# Patient Record
Sex: Male | Born: 1960 | Race: Black or African American | Hispanic: No | Marital: Single
Health system: Southern US, Community
[De-identification: ages and names within clinical notes are randomized; demographics above are authoritative.]

## PROBLEM LIST (undated history)

## (undated) DIAGNOSIS — F10231 Alcohol dependence with withdrawal delirium: Secondary | ICD-10-CM

## (undated) DIAGNOSIS — G40909 Epilepsy, unspecified, not intractable, without status epilepticus: Secondary | ICD-10-CM

## (undated) DIAGNOSIS — J9621 Acute and chronic respiratory failure with hypoxia: Secondary | ICD-10-CM

## (undated) DIAGNOSIS — F201 Disorganized schizophrenia: Secondary | ICD-10-CM

## (undated) DIAGNOSIS — G9341 Metabolic encephalopathy: Secondary | ICD-10-CM

## (undated) DIAGNOSIS — F10931 Alcohol use, unspecified with withdrawal delirium: Secondary | ICD-10-CM

---

## 2019-12-14 ENCOUNTER — Other Ambulatory Visit (HOSPITAL_COMMUNITY): Payer: Medicare Other

## 2019-12-14 ENCOUNTER — Inpatient Hospital Stay
Admit: 2019-12-14 | Discharge: 2020-01-03 | Disposition: A | Discharge status: Skilled Nursing Facility | Payer: Medicare Other | Source: Other Acute Inpatient Hospital | Attending: Internal Medicine | Admitting: Internal Medicine

## 2019-12-14 DIAGNOSIS — G40909 Epilepsy, unspecified, not intractable, without status epilepticus: Secondary | ICD-10-CM

## 2019-12-14 DIAGNOSIS — J9690 Respiratory failure, unspecified, unspecified whether with hypoxia or hypercapnia: Secondary | ICD-10-CM

## 2019-12-14 DIAGNOSIS — J9621 Acute and chronic respiratory failure with hypoxia: Secondary | ICD-10-CM | POA: Diagnosis present

## 2019-12-14 DIAGNOSIS — F10231 Alcohol dependence with withdrawal delirium: Secondary | ICD-10-CM | POA: Diagnosis present

## 2019-12-14 DIAGNOSIS — Z4659 Encounter for fitting and adjustment of other gastrointestinal appliance and device: Secondary | ICD-10-CM

## 2019-12-14 DIAGNOSIS — F201 Disorganized schizophrenia: Secondary | ICD-10-CM | POA: Diagnosis present

## 2019-12-14 DIAGNOSIS — L539 Erythematous condition, unspecified: Secondary | ICD-10-CM

## 2019-12-14 DIAGNOSIS — J189 Pneumonia, unspecified organism: Secondary | ICD-10-CM

## 2019-12-14 DIAGNOSIS — G9341 Metabolic encephalopathy: Secondary | ICD-10-CM | POA: Diagnosis present

## 2019-12-14 DIAGNOSIS — F10931 Alcohol use, unspecified with withdrawal delirium: Secondary | ICD-10-CM | POA: Diagnosis present

## 2019-12-14 HISTORY — DX: Alcohol dependence with withdrawal delirium: F10.231

## 2019-12-14 HISTORY — DX: Disorganized schizophrenia: F20.1

## 2019-12-14 HISTORY — DX: Metabolic encephalopathy: G93.41

## 2019-12-14 HISTORY — DX: Alcohol use, unspecified with withdrawal delirium: F10.931

## 2019-12-14 HISTORY — DX: Acute and chronic respiratory failure with hypoxia: J96.21

## 2019-12-14 HISTORY — DX: Epilepsy, unspecified, not intractable, without status epilepticus: G40.909

## 2019-12-15 ENCOUNTER — Other Ambulatory Visit (HOSPITAL_COMMUNITY): Payer: Medicare Other

## 2019-12-15 LAB — CBC
HCT: 33.4 % — ABNORMAL LOW (ref 39.0–52.0)
Hemoglobin: 11 g/dL — ABNORMAL LOW (ref 13.0–17.0)
MCH: 30.6 pg (ref 26.0–34.0)
MCHC: 32.9 g/dL (ref 30.0–36.0)
MCV: 93 fL (ref 80.0–100.0)
Platelets: 206 10*3/uL (ref 150–400)
RBC: 3.59 MIL/uL — ABNORMAL LOW (ref 4.22–5.81)
RDW: 17.2 % — ABNORMAL HIGH (ref 11.5–15.5)
WBC: 12.4 10*3/uL — ABNORMAL HIGH (ref 4.0–10.5)
nRBC: 0 % (ref 0.0–0.2)

## 2019-12-15 LAB — COMPREHENSIVE METABOLIC PANEL
ALT: 38 U/L (ref 0–44)
AST: 71 U/L — ABNORMAL HIGH (ref 15–41)
Albumin: 2.3 g/dL — ABNORMAL LOW (ref 3.5–5.0)
Alkaline Phosphatase: 111 U/L (ref 38–126)
Anion gap: 10 (ref 5–15)
BUN: 8 mg/dL (ref 6–20)
CO2: 25 mmol/L (ref 22–32)
Calcium: 8.5 mg/dL — ABNORMAL LOW (ref 8.9–10.3)
Chloride: 102 mmol/L (ref 98–111)
Creatinine, Ser: 1.21 mg/dL (ref 0.61–1.24)
GFR calc Af Amer: >60 mL/min (ref >60)
GFR calc non Af Amer: >60 mL/min (ref >60)
Glucose, Bld: 105 mg/dL — ABNORMAL HIGH (ref 70–99)
Potassium: 4.2 mmol/L (ref 3.5–5.1)
Sodium: 137 mmol/L (ref 135–145)
Total Bilirubin: 1 mg/dL (ref 0.3–1.2)
Total Protein: 7 g/dL (ref 6.5–8.1)

## 2019-12-15 LAB — PROTIME-INR
INR: 1.2 (ref 0.8–1.2)
Prothrombin Time: 14.2 seconds (ref 11.4–15.2)

## 2019-12-16 ENCOUNTER — Other Ambulatory Visit (HOSPITAL_COMMUNITY): Payer: Medicare Other

## 2019-12-17 ENCOUNTER — Other Ambulatory Visit (HOSPITAL_COMMUNITY): Payer: Medicare Other

## 2019-12-17 DIAGNOSIS — G9341 Metabolic encephalopathy: Secondary | ICD-10-CM | POA: Diagnosis not present

## 2019-12-17 DIAGNOSIS — F201 Disorganized schizophrenia: Secondary | ICD-10-CM | POA: Diagnosis not present

## 2019-12-17 DIAGNOSIS — J9621 Acute and chronic respiratory failure with hypoxia: Secondary | ICD-10-CM | POA: Diagnosis not present

## 2019-12-17 DIAGNOSIS — G40909 Epilepsy, unspecified, not intractable, without status epilepticus: Secondary | ICD-10-CM

## 2019-12-17 DIAGNOSIS — F10231 Alcohol dependence with withdrawal delirium: Secondary | ICD-10-CM

## 2019-12-17 LAB — BLOOD GAS, ARTERIAL
Acid-Base Excess: 4 mmol/L — ABNORMAL HIGH (ref 0.0–2.0)
Bicarbonate: 27.1 mmol/L (ref 20.0–28.0)
FIO2: 44
O2 Saturation: 92.4 %
Patient temperature: 37.6
pCO2 arterial: 36 mmHg (ref 32.0–48.0)
pH, Arterial: 7.493 — ABNORMAL HIGH (ref 7.350–7.450)
pO2, Arterial: 63.8 mmHg — ABNORMAL LOW (ref 83.0–108.0)

## 2019-12-17 NOTE — Consult Note (Signed)
Pulmonary Critical Care Medicine [facility] [place]  PULMONARY SERVICE  Date of Service: 12/17/2019  PULMONARY CRITICAL CARE Glenn Chapman  SNK:[id number]  DOB: 1960/08/19   DOA: 12/14/2019  Referring Physician: Carron Curie, MD  HPI: Glenn Chapman is a 59 y.o. male seen for follow up of Acute on Chronic Respiratory Failure.  Patient has multiple medical problems brought into the hospital because of having been found unresponsive by the family.  Patient apparently may have had a seizure though it was not witnessed.  When he was in the hospital became more agitated and essentially went into delirium tremens.  Was treated with Precedex and also required Ativan.  The patient now is transferred to [place] for further management he continues to have copious amounts of secretions.  There is concern for loss of airway protection.  Patient right now has a nasal trumpet and is on oxygen therapy.  Review of Systems:  ROS performed and is unremarkable other than noted above.  Past medical history: Schizophrenia Alcohol abuse Alcoholic fatty liver Alcoholic hepatitis Seizure disorder Thrombocytopenia Traumatic brain injury Hematemesis Chronic headaches Cognitive deficits  Past Surgical History:  Procedure Laterality Date  . DEBRIDE ASSOC FX/DISLO SKIN/MUS/BONE Left 11/18/2015  Procedure: LEG LOWER DEBRIDEMENT SKIN, MUSCLE AND BONE; Surgeon: Georgetta Haber, MD; Location: [facility] MAIN OR  . DECOMPRESS ANT/LAT LEG CMPART Left 11/18/2015  Procedure: LEG, LOWER, FASCIOTOMY; Surgeon: Georgetta Haber, MD; Location: [facility] MAIN OR  . TREAT TIBIAL SHAFT FX, INTRAMED IMPLANT Left 11/18/2015  Procedure: TIBIAL SHAFT FRACTURE, BY INTRMEDULLARY IMPLANTW/ OR W/OUT [place]; Surgeon: Georgetta Haber, MD; Location: [facility] MAIN OR   Family History  Problem Relation Name Age of Onset  . Heart Attack Father  . Hypertension Sister  . Diabetes Sister  . Seizures Sister  . Diabetes Maternal  Grandmother    No Known Allergies Social History   Socioeconomic History  . Marital status: Single  Spouse name: Not on file  . Number of children: Not on file  . Years of education: Not on file  . Highest education level: Not on file  Occupational History  . Not on file  Social Needs  . Financial resource strain: Not on file  . Food insecurity  Worry: Not on file  Inability: Not on file  . Transportation needs  Medical: Not on file  Non-medical: Not on file  Tobacco Use  . Smoking status: Never Smoker  . Smokeless tobacco: Never Used  Substance and Sexual Activity  . Alcohol use: Yes  Alcohol/week: 4.0 - 5.0 standard drinks  Types: 4 - 5 Cans of beer per week    Family History: Non-Contributory to the present illness    Medications: Reviewed on Rounds  Physical Exam:  Vitals: Temperature 101.2 pulse 110 respiratory 33 blood pressure is 142/82 saturations 97%  Ventilator Settings right now is on 6 L of oxygen with nasal trumpet  . General: Comfortable at this time . Eyes: Grossly normal lids, irises & conjunctiva . ENT: grossly tongue is normal . Neck: no obvious mass . Cardiovascular: S1-S2 normal no gallop or rub . Respiratory: Scattered very coarse breath sounds with increased upper airway secretions . Abdomen: Soft nontender . Skin: no rash seen on limited exam . Musculoskeletal: not rigid . Psychiatric:unable to assess . Neurologic: no seizure no involuntary movements         Labs on Admission:  Basic Metabolic Panel: Recent Labs  Lab 12/15/19 0517  NA 137  K 4.2  CL 102  CO2 25  GLUCOSE 105*  BUN 8  CREATININE 1.21  CALCIUM 8.5*    Recent Labs  Lab 12/17/19 1050  PHART 7.493*  PCO2ART 36.0  PO2ART 63.8*  HCO3 27.1  O2SAT 92.4    Liver Function Tests: Recent Labs  Lab 12/15/19 0517  AST 71*  ALT 38  ALKPHOS 111  BILITOT 1.0  PROT 7.0  ALBUMIN 2.3*   No results for input(s): LIPASE, AMYLASE in the last 168 hours. No  results for input(s): AMMONIA in the last 168 hours.  CBC: Recent Labs  Lab 12/15/19 0517  WBC 12.4*  HGB 11.0*  HCT 33.4*  MCV 93.0  PLT 206    Cardiac Enzymes: No results for input(s): CKTOTAL, CKMB, CKMBINDEX, TROPONINI in the last 168 hours.  BNP (last 3 results) No results for input(s): BNP in the last 8760 hours.  ProBNP (last 3 results) No results for input(s): PROBNP in the last 8760 hours.   Radiological Exams on Admission: DG Chest Port 1 View  Result Date: 12/15/2019 CLINICAL DATA:  Pneumonia EXAM: PORTABLE CHEST 1 VIEW COMPARISON:  None. FINDINGS: Nasogastric tube tip and side port are in the stomach. There is atelectatic change in the lung bases. Lungs elsewhere clear. Heart is upper normal in size with pulmonary vascularity normal. No adenopathy. No bone lesions. IMPRESSION: Bibasilar atelectasis. Early pneumonia in the bases, particular on the right, cannot be excluded. Lungs elsewhere clear. Heart upper normal in size. Nasogastric tube tip and side port in stomach. Electronically Signed   By: Bretta Bang III M.D.   On: 12/15/2019 13:42   DG Abd Portable 1V  Result Date: 12/16/2019 CLINICAL DATA:  NG tube placement EXAM: PORTABLE ABDOMEN - 1 VIEW COMPARISON:  12/14/2019 FINDINGS: Esophageal tube tip overlies the mid to distal stomach. Consolidation at the left lung base. Visible gas pattern is nonobstructed IMPRESSION: Esophageal tube tip overlies the mid to distal stomach. Electronically Signed   By: Jasmine Pang M.D.   On: 12/16/2019 22:10   DG Abd Portable 1V  Result Date: 12/14/2019 CLINICAL DATA:  59 year old male NG tube placement. EXAM: PORTABLE ABDOMEN - 1 VIEW COMPARISON:  None. FINDINGS: Portable AP semi upright view at 2311 hours. Endotracheal tube tip courses into the stomach, with side hole at the level of the gastric fundus. Mild atelectasis suspected at the left lung base. Negative visible bowel gas pattern. No acute osseous abnormality  identified. IMPRESSION: NG tube placed into the stomach, side hole at the level of the fundus. Electronically Signed   By: Odessa Fleming M.D.   On: 12/14/2019 23:18    Assessment/Plan Active Problems:   Acute on chronic respiratory failure with hypoxia (HCC)   Chronic disorganized schizophrenia (HCC)   Alcohol withdrawal delirium (HCC)   Metabolic encephalopathy   Seizure disorder (HCC)   1. Acute on chronic respiratory failure with hypoxia patient has copious thick secretions which are being difficult to clear.  His mental status is also not conducive to be able to clear secretions.  Suggested aggressive pulmonary toileting.  Follow-up x-ray ABG was also ordered.  Concern is whether he might need to be intubated. 2. Schizophrenia per management of the primary care team. 3. Alcoholic withdrawal patient has been managed with Precedex as well as Ativan.  We will continue to follow along. 4. Metabolic encephalopathy supportive care he is not really showing much improvement 5. Seizure disorder there is no active seizures noted at this time.  I have personally seen and  evaluated the patient, evaluated laboratory and imaging results, formulated the assessment and plan and placed orders. The Patient requires high complexity decision making with multiple systems involvement.  Case was discussed on Rounds with the Respiratory Therapy Director and the Respiratory staff Time Spent  Yevonne Pax, MD [facility] Pulmonary Critical Care Medicine Sleep Medicine

## 2019-12-18 DIAGNOSIS — J9621 Acute and chronic respiratory failure with hypoxia: Secondary | ICD-10-CM | POA: Diagnosis not present

## 2019-12-18 DIAGNOSIS — F201 Disorganized schizophrenia: Secondary | ICD-10-CM | POA: Diagnosis not present

## 2019-12-18 DIAGNOSIS — G9341 Metabolic encephalopathy: Secondary | ICD-10-CM | POA: Diagnosis not present

## 2019-12-18 DIAGNOSIS — F10231 Alcohol dependence with withdrawal delirium: Secondary | ICD-10-CM | POA: Diagnosis not present

## 2019-12-18 LAB — CBC
HCT: 35.1 % — ABNORMAL LOW (ref 39.0–52.0)
Hemoglobin: 11.5 g/dL — ABNORMAL LOW (ref 13.0–17.0)
MCH: 29.9 pg (ref 26.0–34.0)
MCHC: 32.8 g/dL (ref 30.0–36.0)
MCV: 91.2 fL (ref 80.0–100.0)
Platelets: 282 10*3/uL (ref 150–400)
RBC: 3.85 MIL/uL — ABNORMAL LOW (ref 4.22–5.81)
RDW: 16.4 % — ABNORMAL HIGH (ref 11.5–15.5)
WBC: 13.9 10*3/uL — ABNORMAL HIGH (ref 4.0–10.5)
nRBC: 0 % (ref 0.0–0.2)

## 2019-12-18 LAB — BASIC METABOLIC PANEL
Anion gap: 11 (ref 5–15)
BUN: 6 mg/dL (ref 6–20)
CO2: 27 mmol/L (ref 22–32)
Calcium: 8.8 mg/dL — ABNORMAL LOW (ref 8.9–10.3)
Chloride: 98 mmol/L (ref 98–111)
Creatinine, Ser: 0.79 mg/dL (ref 0.61–1.24)
GFR calc Af Amer: >60 mL/min (ref >60)
GFR calc non Af Amer: >60 mL/min (ref >60)
Glucose, Bld: 128 mg/dL — ABNORMAL HIGH (ref 70–99)
Potassium: 3.4 mmol/L — ABNORMAL LOW (ref 3.5–5.1)
Sodium: 136 mmol/L (ref 135–145)

## 2019-12-18 NOTE — Progress Notes (Signed)
Pulmonary Critical Care Medicine [facility] [place]   PULMONARY CRITICAL CARE SERVICE  PROGRESS NOTE  Date of Service: 12/18/2019  Glenn Chapman  BTD:[id number]  DOB: [birth year]   DOA: 12/14/2019  Referring Physician: Carron Curie, MD  HPI: Glenn Chapman is a 59 y.o. male seen for follow up of Acute on Chronic Respiratory Failure.  Patient remains on nasal cannula right now is on 4 L of oxygen has copious secretions noted.  Appears to be otherwise comfortable.  Medications: Reviewed on Rounds  Physical Exam:  Vitals: Temperature is 100.0 pulse 111 respiratory 35 blood pressure is 144/65 saturations 98%  Ventilator Settings on nasal cannula 4 L of oxygen  . General: Comfortable at this time . Eyes: Grossly normal lids, irises & conjunctiva . ENT: grossly tongue is normal . Neck: no obvious mass . Cardiovascular: S1 S2 normal no gallop . Respiratory: No rhonchi very coarse breath sounds . Abdomen: soft . Skin: no rash seen on limited exam . Musculoskeletal: not rigid . Psychiatric:unable to assess . Neurologic: no seizure no involuntary movements         Lab Data:   Basic Metabolic Panel: Recent Labs  Lab 12/15/19 0517 12/18/19 0700  NA 137 136  K 4.2 3.4*  CL 102 98  CO2 25 27  GLUCOSE 105* 128*  BUN 8 6  CREATININE 1.21 0.79  CALCIUM 8.5* 8.8*    ABG: Recent Labs  Lab 12/17/19 1050  PHART 7.493*  PCO2ART 36.0  PO2ART 63.8*  HCO3 27.1  O2SAT 92.4    Liver Function Tests: Recent Labs  Lab 12/15/19 0517  AST 71*  ALT 38  ALKPHOS 111  BILITOT 1.0  PROT 7.0  ALBUMIN 2.3*   No results for input(s): LIPASE, AMYLASE in the last 168 hours. No results for input(s): AMMONIA in the last 168 hours.  CBC: Recent Labs  Lab 12/15/19 0517 12/18/19 0700  WBC 12.4* 13.9*  HGB 11.0* 11.5*  HCT 33.4* 35.1*  MCV 93.0 91.2  PLT 206 282    Cardiac Enzymes: No results for input(s): CKTOTAL, CKMB, CKMBINDEX, TROPONINI in the last 168  hours.  BNP (last 3 results) No results for input(s): BNP in the last 8760 hours.  ProBNP (last 3 results) No results for input(s): PROBNP in the last 8760 hours.  Radiological Exams: DG CHEST PORT 1 VIEW  Result Date: 12/17/2019 CLINICAL DATA:  Respiratory failure EXAM: PORTABLE CHEST 1 VIEW COMPARISON:  12/15/2019 FINDINGS: Gastric catheter is again noted within the stomach. Cardiac shadow is stable. Increasing left retrocardiac infiltrate is noted. Interval clearing in the right base is noted with some patchy infiltrate in the right mid lung. IMPRESSION: Improved aeration in the right base although increasing infiltrate is noted in the right mid lung and left lung base. Electronically Signed   By: Alcide Clever M.D.   On: 12/17/2019 11:41   DG Abd Portable 1V  Result Date: 12/16/2019 CLINICAL DATA:  NG tube placement EXAM: PORTABLE ABDOMEN - 1 VIEW COMPARISON:  12/14/2019 FINDINGS: Esophageal tube tip overlies the mid to distal stomach. Consolidation at the left lung base. Visible gas pattern is nonobstructed IMPRESSION: Esophageal tube tip overlies the mid to distal stomach. Electronically Signed   By: Jasmine Pang M.D.   On: 12/16/2019 22:10    Assessment/Plan Active Problems:   Acute on chronic respiratory failure with hypoxia (HCC)   Chronic disorganized schizophrenia (HCC)   Alcohol withdrawal delirium (HCC)   Metabolic encephalopathy   Seizure disorder (HCC)  1. Acute on chronic respiratory failure hypoxia patient still with copious secretions requiring frequent suctioning.  Patient continues to have a nasal trumpet in place. 2. Chronic schizophrenia no change we will continue with supportive care 3. Alcoholic withdrawal no active withdrawal is seen patient has been treated with Precedex prior 4. Metabolic encephalopathy no change 5. Seizure disorder no active seizures noted we will continue to monitor   I have personally seen and evaluated the patient, evaluated  laboratory and imaging results, formulated the assessment and plan and placed orders. The Patient requires high complexity decision making with multiple systems involvement.  Rounds were done with the Respiratory Therapy Director and Staff therapists and discussed with nursing staff also.  Yevonne Pax, MD [facility] Pulmonary Critical Care Medicine Sleep Medicine

## 2019-12-19 DIAGNOSIS — F10231 Alcohol dependence with withdrawal delirium: Secondary | ICD-10-CM | POA: Diagnosis not present

## 2019-12-19 DIAGNOSIS — G9341 Metabolic encephalopathy: Secondary | ICD-10-CM | POA: Diagnosis not present

## 2019-12-19 DIAGNOSIS — J9621 Acute and chronic respiratory failure with hypoxia: Secondary | ICD-10-CM | POA: Diagnosis not present

## 2019-12-19 DIAGNOSIS — F201 Disorganized schizophrenia: Secondary | ICD-10-CM | POA: Diagnosis not present

## 2019-12-19 NOTE — Progress Notes (Addendum)
Pulmonary Critical Care Medicine [facility] [place]   PULMONARY CRITICAL CARE SERVICE  PROGRESS NOTE  Date of Service: 12/19/2019  Glenn Chapman  BJY:[id number]  DOB: 1960-11-26   DOA: 12/14/2019  Referring Physician: Carron Curie, MD  HPI: Glenn Chapman is a 59 y.o. male seen for follow up of Acute on Chronic Respiratory Failure.  Patient is now on room air satting well with no fever or distress.  Medications: Reviewed on Rounds  Physical Exam:  Vitals: Pulse 1 5 respirations 29 BP 131/78 O2 sat 97% temp 98.9  Ventilator Settings room air  . General: Comfortable at this time . Eyes: Grossly normal lids, irises & conjunctiva . ENT: grossly tongue is normal . Neck: no obvious mass . Cardiovascular: S1 S2 normal no gallop . Respiratory: No rales or rhonchi noted . Abdomen: soft . Skin: no rash seen on limited exam . Musculoskeletal: not rigid . Psychiatric:unable to assess . Neurologic: no seizure no involuntary movements         Lab Data:   Basic Metabolic Panel: Recent Labs  Lab 12/15/19 0517 12/18/19 0700  NA 137 136  K 4.2 3.4*  CL 102 98  CO2 25 27  GLUCOSE 105* 128*  BUN 8 6  CREATININE 1.21 0.79  CALCIUM 8.5* 8.8*    ABG: Recent Labs  Lab 12/17/19 1050  PHART 7.493*  PCO2ART 36.0  PO2ART 63.8*  HCO3 27.1  O2SAT 92.4    Liver Function Tests: Recent Labs  Lab 12/15/19 0517  AST 71*  ALT 38  ALKPHOS 111  BILITOT 1.0  PROT 7.0  ALBUMIN 2.3*   No results for input(s): LIPASE, AMYLASE in the last 168 hours. No results for input(s): AMMONIA in the last 168 hours.  CBC: Recent Labs  Lab 12/15/19 0517 12/18/19 0700  WBC 12.4* 13.9*  HGB 11.0* 11.5*  HCT 33.4* 35.1*  MCV 93.0 91.2  PLT 206 282    Cardiac Enzymes: No results for input(s): CKTOTAL, CKMB, CKMBINDEX, TROPONINI in the last 168 hours.  BNP (last 3 results) No results for input(s): BNP in the last 8760 hours.  ProBNP (last 3 results) No results for  input(s): PROBNP in the last 8760 hours.  Radiological Exams: No results found.  Assessment/Plan Active Problems:   * No active hospital problems. *   1. Acute on chronic respiratory failure hypoxia patient still with copious secretions requiring frequent suctioning.  Patient continues to have a nasal trumpet in place. 2. Chronic schizophrenia no change we will continue with supportive care 3. Alcoholic withdrawal no active withdrawal is seen patient has been treated with Precedex prior 4. Metabolic encephalopathy no change 5. Seizure disorder no active seizures noted we will continue to monitor   I have personally seen and evaluated the patient, evaluated laboratory and imaging results, formulated the assessment and plan and placed orders. The Patient requires high complexity decision making with multiple systems involvement.  Rounds were done with the Respiratory Therapy Director and Staff therapists and discussed with nursing staff also.  Yevonne Pax, MD [facility] Pulmonary Critical Care Medicine Sleep Medicine

## 2019-12-20 ENCOUNTER — Other Ambulatory Visit (HOSPITAL_COMMUNITY): Payer: Medicare Other

## 2019-12-20 LAB — CULTURE, RESPIRATORY W GRAM STAIN: Culture: NO GROWTH

## 2019-12-20 LAB — VANCOMYCIN, TROUGH: Vancomycin Tr: <4 ug/mL — ABNORMAL LOW (ref 15–20)

## 2019-12-21 ENCOUNTER — Encounter: Payer: Self-pay | Admitting: Internal Medicine

## 2019-12-21 DIAGNOSIS — G9341 Metabolic encephalopathy: Secondary | ICD-10-CM | POA: Diagnosis present

## 2019-12-21 DIAGNOSIS — F201 Disorganized schizophrenia: Secondary | ICD-10-CM | POA: Diagnosis present

## 2019-12-21 DIAGNOSIS — G40909 Epilepsy, unspecified, not intractable, without status epilepticus: Secondary | ICD-10-CM

## 2019-12-21 DIAGNOSIS — J9621 Acute and chronic respiratory failure with hypoxia: Secondary | ICD-10-CM | POA: Diagnosis present

## 2019-12-21 DIAGNOSIS — F10931 Alcohol use, unspecified with withdrawal delirium: Secondary | ICD-10-CM | POA: Diagnosis present

## 2019-12-21 DIAGNOSIS — F10231 Alcohol dependence with withdrawal delirium: Secondary | ICD-10-CM | POA: Diagnosis present

## 2019-12-21 LAB — CBC
HCT: 35.1 % — ABNORMAL LOW (ref 39.0–52.0)
Hemoglobin: 11.4 g/dL — ABNORMAL LOW (ref 13.0–17.0)
MCH: 29.7 pg (ref 26.0–34.0)
MCHC: 32.5 g/dL (ref 30.0–36.0)
MCV: 91.4 fL (ref 80.0–100.0)
Platelets: 416 10*3/uL — ABNORMAL HIGH (ref 150–400)
RBC: 3.84 MIL/uL — ABNORMAL LOW (ref 4.22–5.81)
RDW: 16.4 % — ABNORMAL HIGH (ref 11.5–15.5)
WBC: 11.4 10*3/uL — ABNORMAL HIGH (ref 4.0–10.5)
nRBC: 0 % (ref 0.0–0.2)

## 2019-12-21 LAB — BASIC METABOLIC PANEL
Anion gap: 12 (ref 5–15)
BUN: 7 mg/dL (ref 6–20)
CO2: 26 mmol/L (ref 22–32)
Calcium: 9.1 mg/dL (ref 8.9–10.3)
Chloride: 102 mmol/L (ref 98–111)
Creatinine, Ser: 0.75 mg/dL (ref 0.61–1.24)
GFR calc Af Amer: >60 mL/min (ref >60)
GFR calc non Af Amer: >60 mL/min (ref >60)
Glucose, Bld: 88 mg/dL (ref 70–99)
Potassium: 3.1 mmol/L — ABNORMAL LOW (ref 3.5–5.1)
Sodium: 140 mmol/L (ref 135–145)

## 2019-12-22 LAB — POTASSIUM: Potassium: 3.2 mmol/L — ABNORMAL LOW (ref 3.5–5.1)

## 2019-12-23 LAB — POTASSIUM: Potassium: 4 mmol/L (ref 3.5–5.1)

## 2019-12-24 LAB — POTASSIUM: Potassium: 3.9 mmol/L (ref 3.5–5.1)

## 2019-12-26 ENCOUNTER — Other Ambulatory Visit (HOSPITAL_COMMUNITY): Payer: Medicare Other

## 2019-12-27 LAB — CBC
HCT: 41.1 % (ref 39.0–52.0)
Hemoglobin: 13.4 g/dL (ref 13.0–17.0)
MCH: 30.5 pg (ref 26.0–34.0)
MCHC: 32.6 g/dL (ref 30.0–36.0)
MCV: 93.4 fL (ref 80.0–100.0)
Platelets: 325 10*3/uL (ref 150–400)
RBC: 4.4 MIL/uL (ref 4.22–5.81)
RDW: 17.8 % — ABNORMAL HIGH (ref 11.5–15.5)
WBC: 16.8 10*3/uL — ABNORMAL HIGH (ref 4.0–10.5)
nRBC: 0 % (ref 0.0–0.2)

## 2019-12-27 LAB — BASIC METABOLIC PANEL
Anion gap: 12 (ref 5–15)
BUN: 8 mg/dL (ref 6–20)
CO2: 27 mmol/L (ref 22–32)
Calcium: 9.2 mg/dL (ref 8.9–10.3)
Chloride: 101 mmol/L (ref 98–111)
Creatinine, Ser: 0.7 mg/dL (ref 0.61–1.24)
GFR calc Af Amer: >60 mL/min (ref >60)
GFR calc non Af Amer: >60 mL/min (ref >60)
Glucose, Bld: 83 mg/dL (ref 70–99)
Potassium: 4.1 mmol/L (ref 3.5–5.1)
Sodium: 140 mmol/L (ref 135–145)

## 2019-12-28 LAB — CBC
HCT: 37.9 % — ABNORMAL LOW (ref 39.0–52.0)
Hemoglobin: 12.1 g/dL — ABNORMAL LOW (ref 13.0–17.0)
MCH: 29.4 pg (ref 26.0–34.0)
MCHC: 31.9 g/dL (ref 30.0–36.0)
MCV: 92 fL (ref 80.0–100.0)
Platelets: 309 10*3/uL (ref 150–400)
RBC: 4.12 MIL/uL — ABNORMAL LOW (ref 4.22–5.81)
RDW: 17.7 % — ABNORMAL HIGH (ref 11.5–15.5)
WBC: 14.1 10*3/uL — ABNORMAL HIGH (ref 4.0–10.5)
nRBC: 0 % (ref 0.0–0.2)

## 2019-12-29 LAB — HEMOGLOBIN AND HEMATOCRIT, BLOOD
HCT: 36.6 % — ABNORMAL LOW (ref 39.0–52.0)
Hemoglobin: 11.8 g/dL — ABNORMAL LOW (ref 13.0–17.0)

## 2020-01-02 LAB — CBC
HCT: 38.5 % — ABNORMAL LOW (ref 39.0–52.0)
Hemoglobin: 12.5 g/dL — ABNORMAL LOW (ref 13.0–17.0)
MCH: 29.7 pg (ref 26.0–34.0)
MCHC: 32.5 g/dL (ref 30.0–36.0)
MCV: 91.4 fL (ref 80.0–100.0)
Platelets: 203 10*3/uL (ref 150–400)
RBC: 4.21 MIL/uL — ABNORMAL LOW (ref 4.22–5.81)
RDW: 17.6 % — ABNORMAL HIGH (ref 11.5–15.5)
WBC: 11.9 10*3/uL — ABNORMAL HIGH (ref 4.0–10.5)
nRBC: 0 % (ref 0.0–0.2)

## 2020-01-02 LAB — NOVEL CORONAVIRUS, NAA (HOSP ORDER, SEND-OUT TO REF LAB; TAT 18-24 HRS): SARS-CoV-2, NAA: NOT DETECTED

## 2020-01-02 LAB — BASIC METABOLIC PANEL
Anion gap: 11 (ref 5–15)
BUN: 7 mg/dL (ref 6–20)
CO2: 26 mmol/L (ref 22–32)
Calcium: 9.6 mg/dL (ref 8.9–10.3)
Chloride: 102 mmol/L (ref 98–111)
Creatinine, Ser: 0.7 mg/dL (ref 0.61–1.24)
GFR calc Af Amer: >60 mL/min (ref >60)
GFR calc non Af Amer: >60 mL/min (ref >60)
Glucose, Bld: 102 mg/dL — ABNORMAL HIGH (ref 70–99)
Potassium: 3.4 mmol/L — ABNORMAL LOW (ref 3.5–5.1)
Sodium: 139 mmol/L (ref 135–145)

## 2020-10-27 IMAGING — DX DG ABD PORTABLE 1V
1 series · 1 of 1 positions shown · non-contrast
Comparison: 12/14/2019

CLINICAL DATA: NG tube placement

EXAM:
PORTABLE ABDOMEN - 1 VIEW

[abdomen kub]
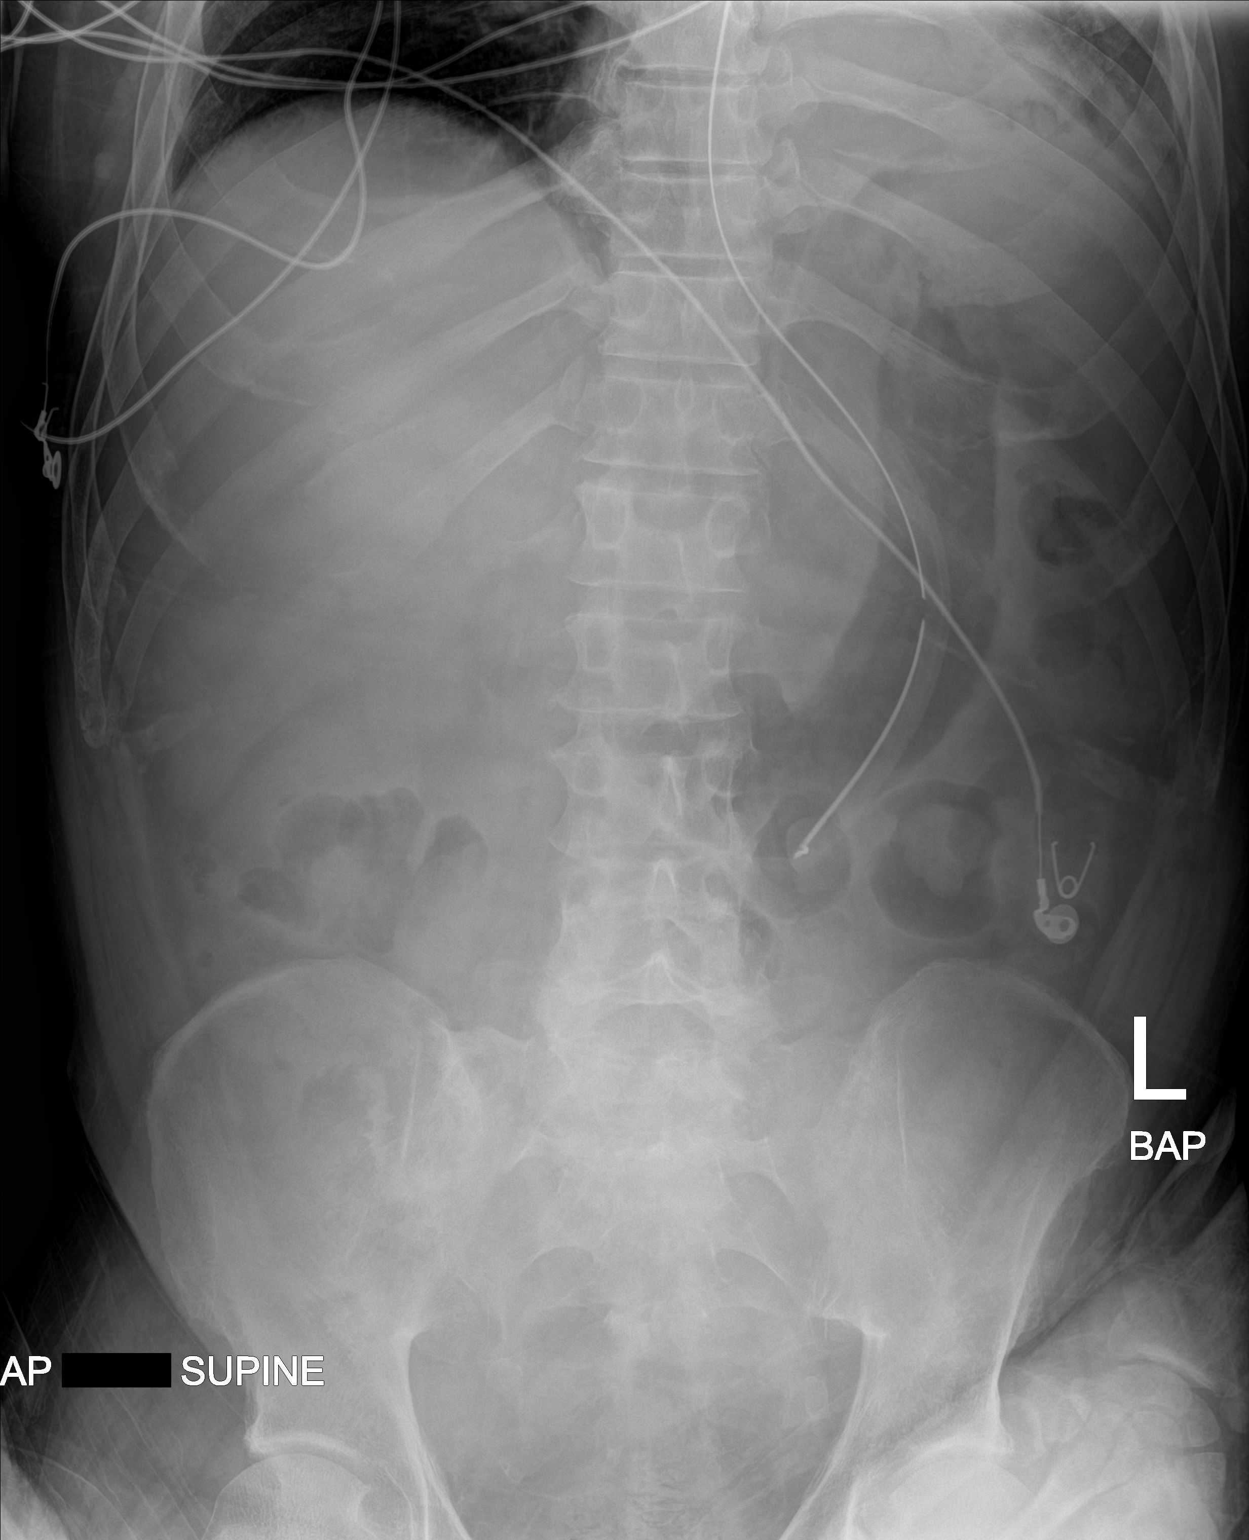

[1 of 1 positions shown; findings below may reference images not displayed]

FINDINGS: Esophageal tube tip overlies the mid to distal stomach.
Consolidation at the left lung base. Visible gas pattern is
nonobstructed
IMPRESSION: Esophageal tube tip overlies the mid to distal stomach.

## 2020-11-06 IMAGING — DX DG KNEE 1-2V PORT*R*
1 series · 2 of 2 positions shown · non-contrast
Comparison: None.

CLINICAL DATA: Right anterior knee pain

EXAM:
PORTABLE RIGHT KNEE - 1-2 VIEW

[Series 1: knee · 0.14mm/px · 2 of 2 slices shown]
[im 1/2]
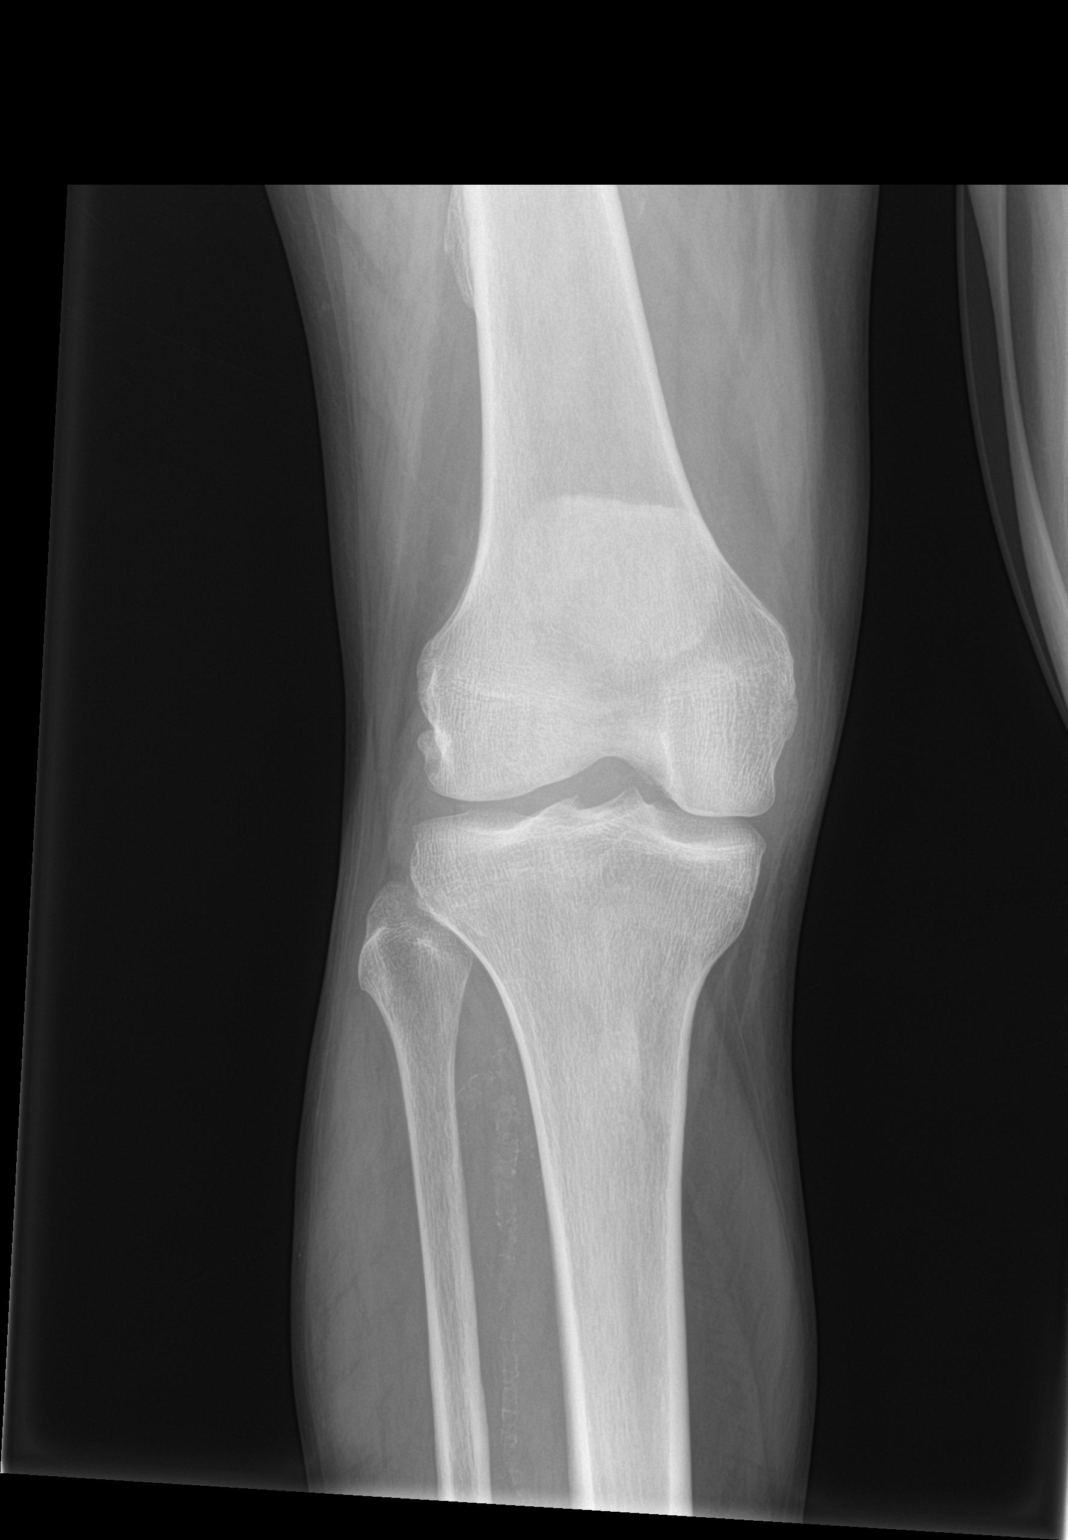
[im 2/2]
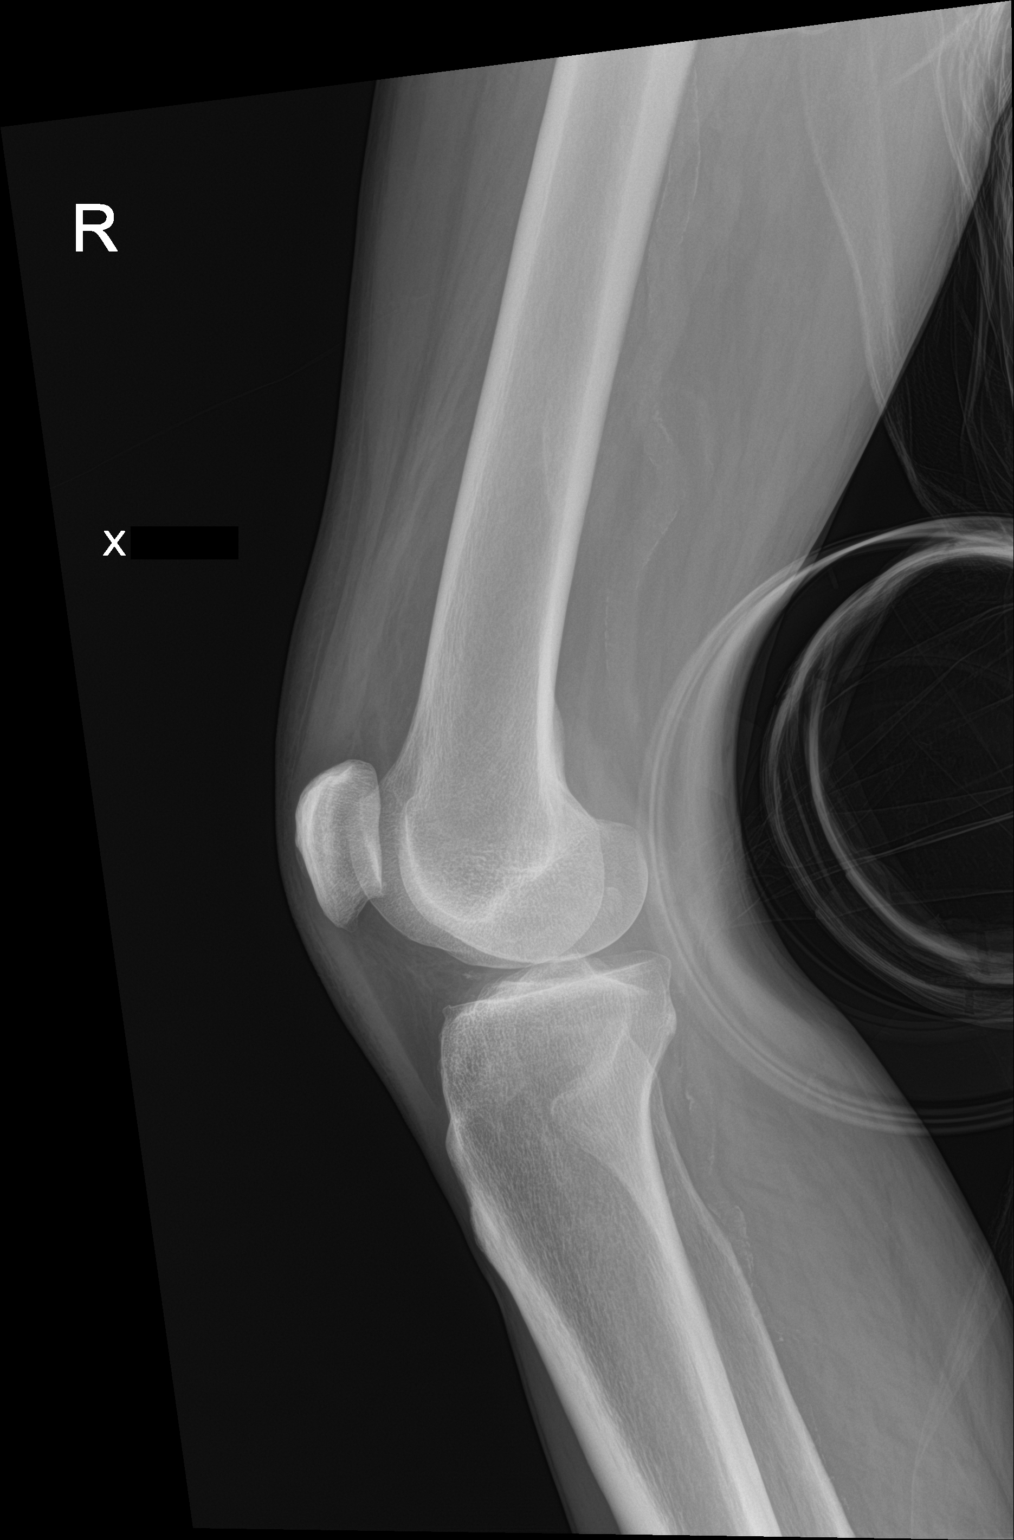

[2 of 2 positions shown; findings below may reference images not displayed]

FINDINGS: Joint spaces well maintained. No joint space narrowing or
degenerative change. No joint effusion

Early arterial calcification in the femoral and tibial arteries.
IMPRESSION: Negative right knee

Early arterial calcification.

## 2021-02-01 DEATH — deceased
